# Patient Record
Sex: Female | Born: 1940 | Race: White | Hispanic: No | Marital: Single | Smoking: Never smoker
Health system: Southern US, Community
[De-identification: ages and names within clinical notes are randomized; demographics above are authoritative.]

## PROBLEM LIST (undated history)

## (undated) DIAGNOSIS — R42 Dizziness and giddiness: Secondary | ICD-10-CM

## (undated) DIAGNOSIS — I10 Essential (primary) hypertension: Secondary | ICD-10-CM

## (undated) DIAGNOSIS — H3530 Unspecified macular degeneration: Secondary | ICD-10-CM

## (undated) HISTORY — PX: ABDOMINAL HYSTERECTOMY: SHX81

## (undated) HISTORY — PX: RECTOCELE REPAIR: SHX761

## (undated) HISTORY — DX: Essential (primary) hypertension: I10

---

## 1998-07-04 ENCOUNTER — Other Ambulatory Visit: Admission: RE | Admit: 1998-07-04 | Discharge: 1998-07-04 | Payer: Self-pay | Admitting: Obstetrics and Gynecology

## 1999-08-28 ENCOUNTER — Other Ambulatory Visit: Admission: RE | Admit: 1999-08-28 | Discharge: 1999-08-28 | Payer: Self-pay | Admitting: Obstetrics and Gynecology

## 2010-03-26 IMAGING — CR DG CERVICAL SPINE COMPLETE 4+V
1 series · 6 of 6 positions shown · non-contrast
Comparison: NONE

CLINICAL DATA: Bilateral hand numbness and tingling. 

CERVICAL SPINE SERIES

[Series 1: view not recorded · 0.17mm/px · 6 of 6 slices shown]
[im 1/6]
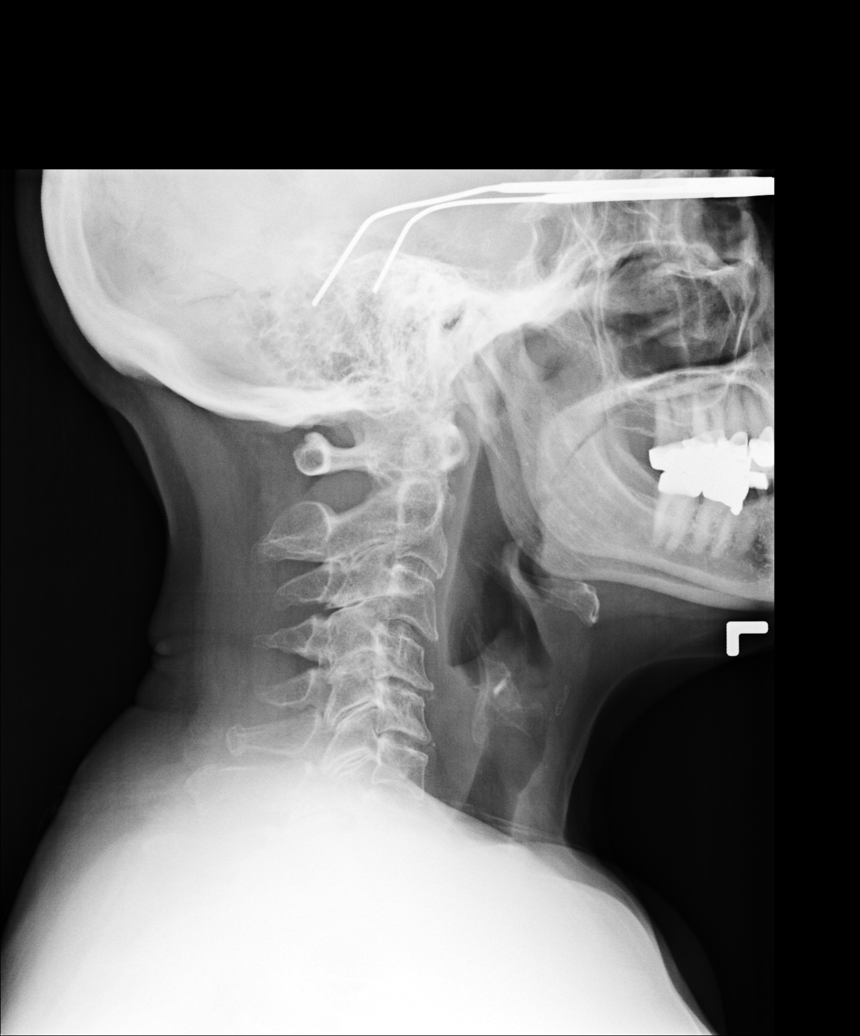
[im 2/6]
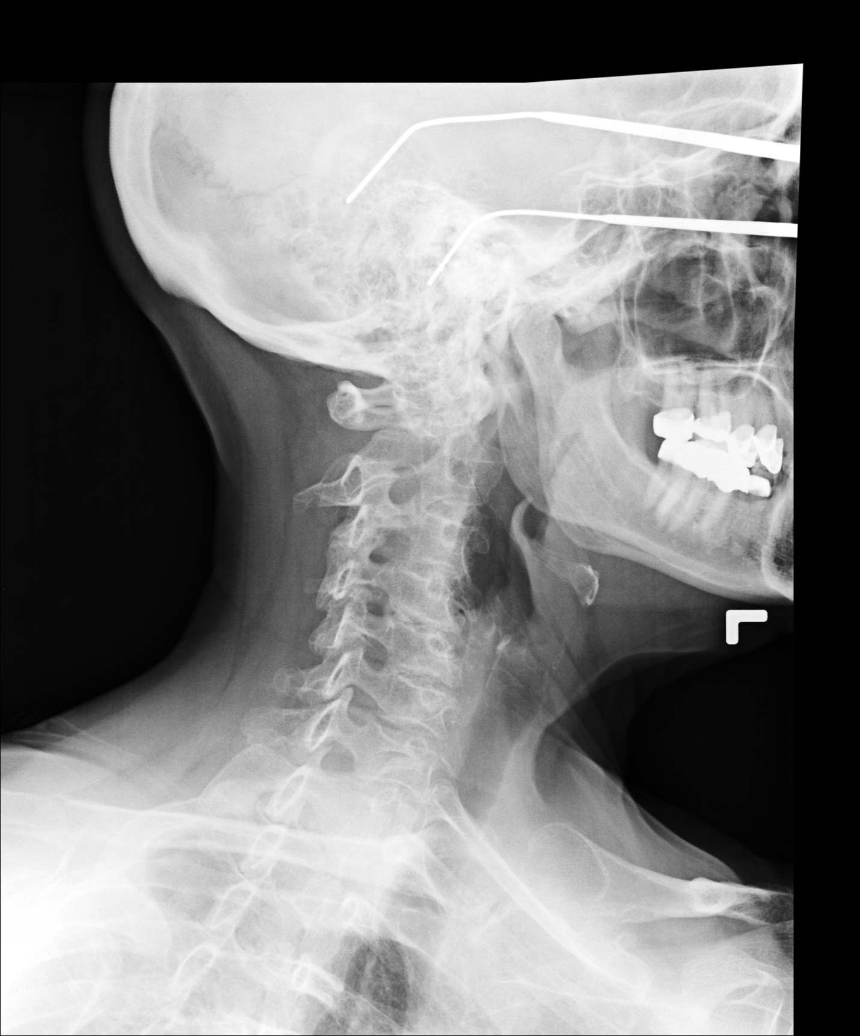
[im 3/6]
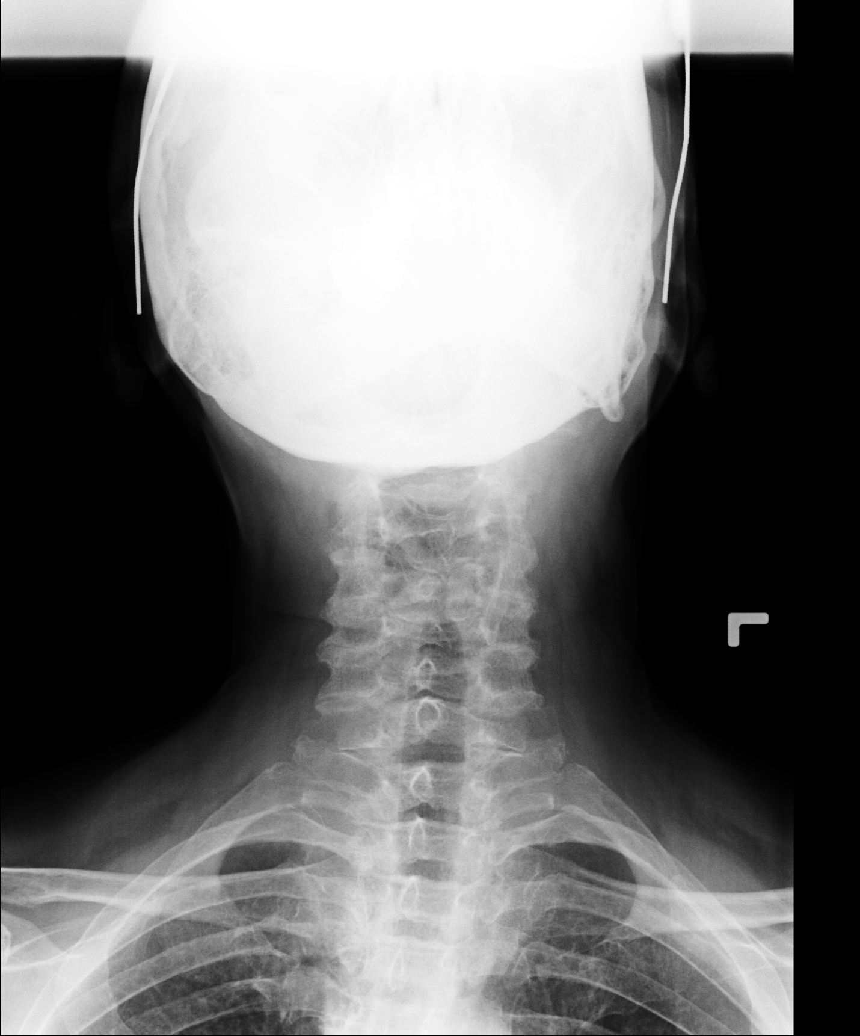
[im 4/6]
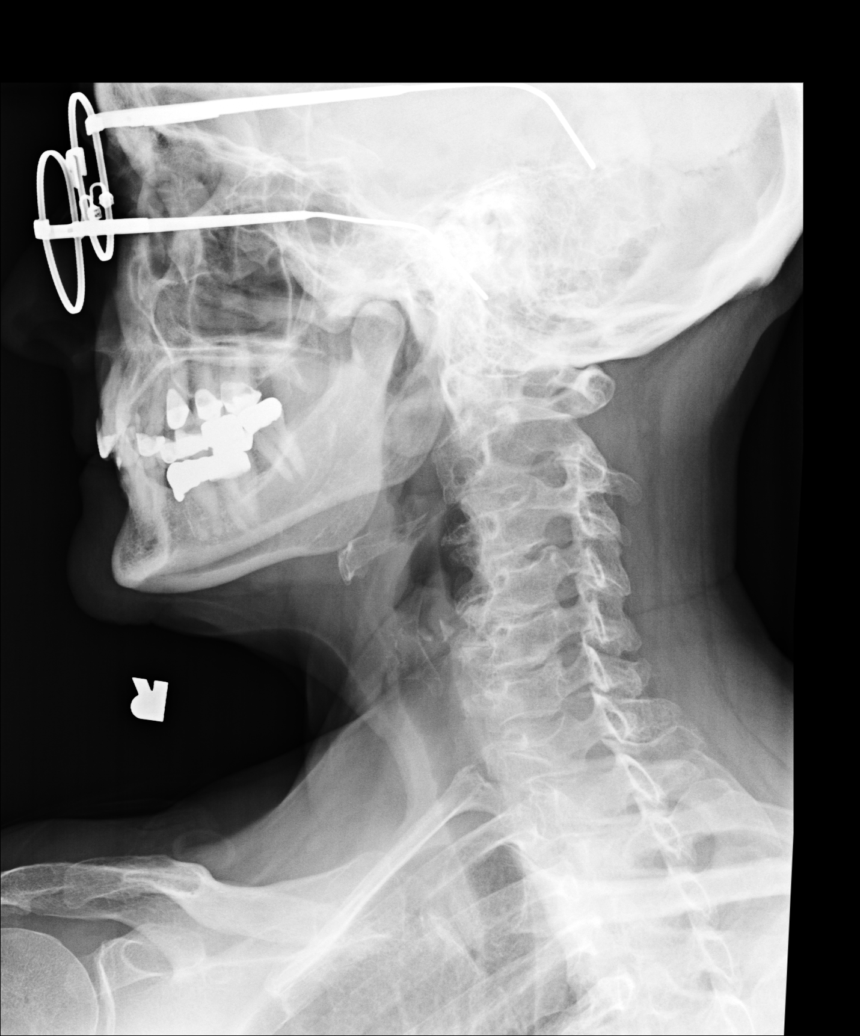
[im 5/6]
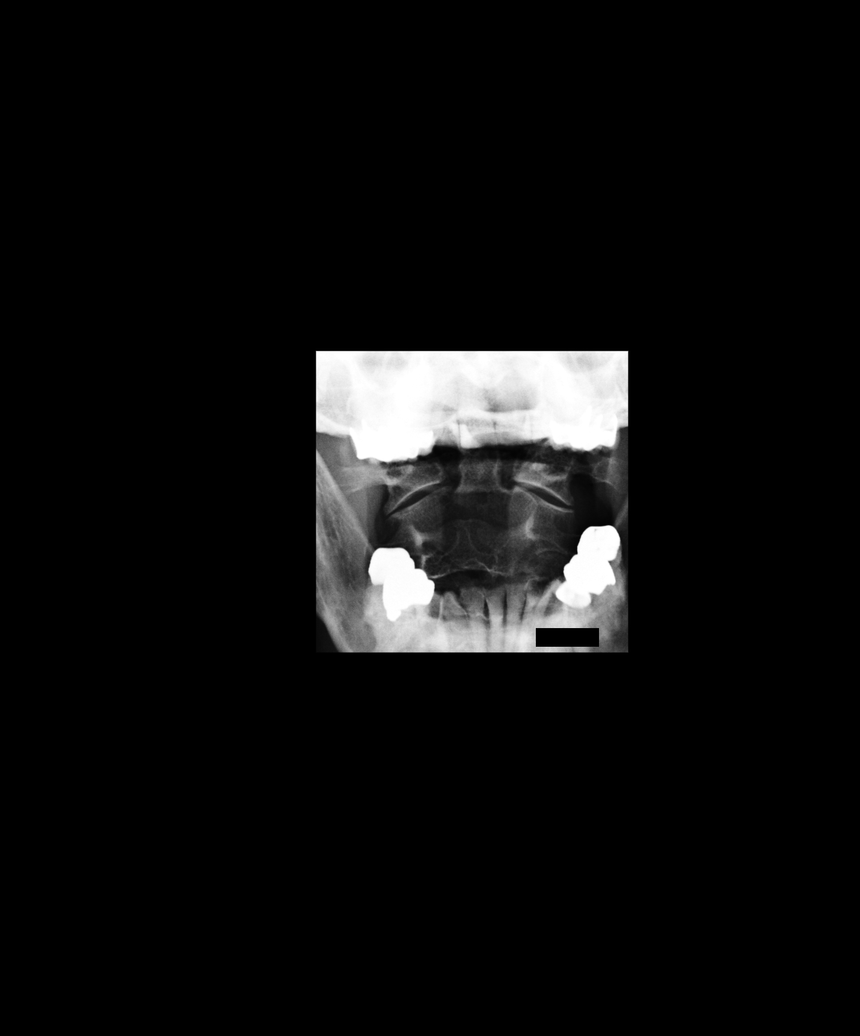
[im 6/6]
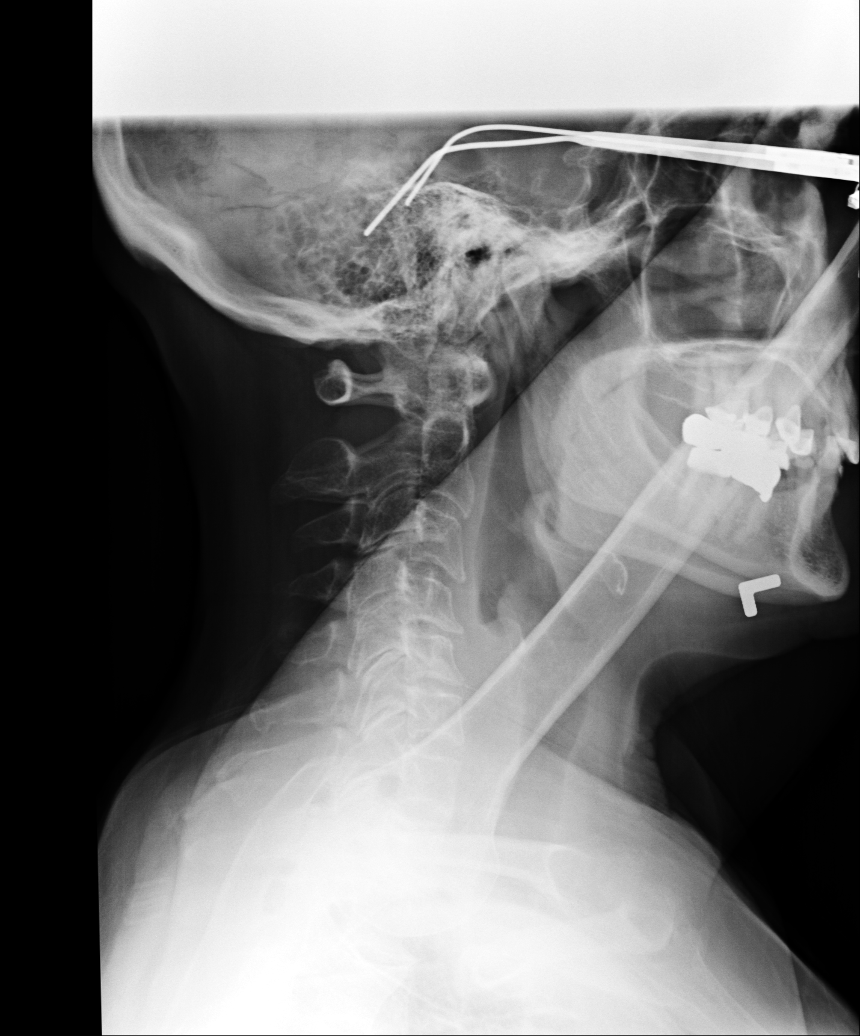

[6 of 6 positions shown; findings below may reference images not displayed]

FINDINGS: There is slight reversal of normal lordosis. Minimal 
forward slippage of C3 on C4 of 1-2 mm is noted. Mild disc space 
narrowing with spurring at C4-C5 and C5-C6. The exit foramina 
demonstrate minimal osteophytic encroachment but no high-grade 
stenosis. No fracture, dislocation, or prevertebral soft tissue 
swelling.
IMPRESSION: Loss of normal lordosis. Spondylosis. Jevg Matto 
07/17/2007  Tran Date:  07/17/2007 DAS  [REDACTED]

## 2010-04-11 ENCOUNTER — Ambulatory Visit: Payer: Medicare Other | Attending: Neurology | Admitting: Physical Therapy

## 2010-04-11 DIAGNOSIS — R269 Unspecified abnormalities of gait and mobility: Secondary | ICD-10-CM | POA: Insufficient documentation

## 2010-04-11 DIAGNOSIS — R42 Dizziness and giddiness: Secondary | ICD-10-CM | POA: Insufficient documentation

## 2010-04-11 DIAGNOSIS — IMO0001 Reserved for inherently not codable concepts without codable children: Secondary | ICD-10-CM | POA: Insufficient documentation

## 2010-04-18 ENCOUNTER — Ambulatory Visit: Payer: Medicare Other | Admitting: Physical Therapy

## 2010-05-25 ENCOUNTER — Encounter: Payer: Medicare Other | Admitting: Physical Therapy

## 2010-05-25 ENCOUNTER — Ambulatory Visit: Payer: Medicare Other | Attending: Neurology | Admitting: Physical Therapy

## 2010-05-25 DIAGNOSIS — R269 Unspecified abnormalities of gait and mobility: Secondary | ICD-10-CM | POA: Insufficient documentation

## 2010-05-25 DIAGNOSIS — IMO0001 Reserved for inherently not codable concepts without codable children: Secondary | ICD-10-CM | POA: Insufficient documentation

## 2010-05-25 DIAGNOSIS — R42 Dizziness and giddiness: Secondary | ICD-10-CM | POA: Insufficient documentation

## 2012-03-29 ENCOUNTER — Ambulatory Visit (INDEPENDENT_AMBULATORY_CARE_PROVIDER_SITE_OTHER): Payer: Medicare Other | Admitting: Internal Medicine

## 2012-03-29 VITALS — BP 168/92 | HR 126 | Temp 97.6°F | Resp 16 | Ht 61.25 in | Wt 167.4 lb

## 2012-03-29 DIAGNOSIS — I10 Essential (primary) hypertension: Secondary | ICD-10-CM

## 2012-03-29 MED ORDER — CHLORTHALIDONE 25 MG PO TABS: 25.0000 mg | ORAL_TABLET | Freq: Every day | ORAL | Status: DC

## 2012-03-29 NOTE — Progress Notes (Signed)
  Subjective:    Patient ID: Nicole Robertson, female    DOB: Jun 13, 1940, 72 y.o.   MRN: [id number]  HPI patient of Dr. Zachery Dauer who has had recent problems with blood pressure readings about which she is very anxious Medications have been changed/she has taken them episodically because she is afraid when she takes them of side effects She reports continued problems with being dizzy, fuzzy, tired and so forth for the last 5 or 6 years since beginning blood pressure medications/ she has put up with the symptoms but doesn't like them anymore and wants to quit  Past medical history negative up until the diagnosis of hypertension  [facility] Zachery Dauer Daughter nurse Sister is cna  Review of Systems No recent weight changes No fatigue No palpitations or chest pain No vision changes No paresthesias or peripheral neurological symptoms    Objective:   Physical Exam BP 168/92  Pulse 126  Temp(Src) 97.6 F (36.4 C) (Oral)  Resp 16  Ht 5' 1.25" (1.556 m)  Wt 167 lb 6.4 oz (75.932 kg)  BMI 31.36 kg/m2  SpO2 96% Very anxious HEENT clear No thyromegaly Heart regular without murmur/rate now 85 after tachycardia at presentation Lungs clear      Assessment & Plan:  Problem #1 hypertension and needed treatment Problem #2 anxiety about side effects Problem #3 anxiety about having a medical diagnosis  Advise restarting a new medicine she's never been on Chlorthalidone 25 mg daily Reassured that such recent onset hypertension and stable family history suggest it would be a long time before she has the secondary effects of hypertension  to followup with Dr. Zachery Dauer as scheduled

## 2012-03-31 DIAGNOSIS — I10 Essential (primary) hypertension: Secondary | ICD-10-CM | POA: Insufficient documentation

## 2012-04-04 ENCOUNTER — Emergency Department (HOSPITAL_COMMUNITY): Payer: Medicare Other

## 2012-04-04 ENCOUNTER — Inpatient Hospital Stay (HOSPITAL_COMMUNITY)
Admission: EM | Admit: 2012-04-04 | Discharge: 2012-04-06 | DRG: 641 | Disposition: A | Discharge status: Home / Self Care | Payer: Medicare Other | Attending: Internal Medicine | Admitting: Internal Medicine

## 2012-04-04 ENCOUNTER — Encounter (HOSPITAL_COMMUNITY): Payer: Self-pay | Admitting: Emergency Medicine

## 2012-04-04 DIAGNOSIS — E871 Hypo-osmolality and hyponatremia: Principal | ICD-10-CM

## 2012-04-04 DIAGNOSIS — E876 Hypokalemia: Secondary | ICD-10-CM | POA: Diagnosis present

## 2012-04-04 DIAGNOSIS — Y92009 Unspecified place in unspecified non-institutional (private) residence as the place of occurrence of the external cause: Secondary | ICD-10-CM

## 2012-04-04 DIAGNOSIS — I493 Ventricular premature depolarization: Secondary | ICD-10-CM | POA: Diagnosis present

## 2012-04-04 DIAGNOSIS — I10 Essential (primary) hypertension: Secondary | ICD-10-CM | POA: Diagnosis present

## 2012-04-04 DIAGNOSIS — T502X5A Adverse effect of carbonic-anhydrase inhibitors, benzothiadiazides and other diuretics, initial encounter: Secondary | ICD-10-CM | POA: Diagnosis present

## 2012-04-04 DIAGNOSIS — I4949 Other premature depolarization: Secondary | ICD-10-CM | POA: Diagnosis present

## 2012-04-04 DIAGNOSIS — R42 Dizziness and giddiness: Secondary | ICD-10-CM | POA: Diagnosis not present

## 2012-04-04 DIAGNOSIS — E878 Other disorders of electrolyte and fluid balance, not elsewhere classified: Secondary | ICD-10-CM

## 2012-04-04 DIAGNOSIS — Z90710 Acquired absence of both cervix and uterus: Secondary | ICD-10-CM

## 2012-04-04 DIAGNOSIS — H3530 Unspecified macular degeneration: Secondary | ICD-10-CM | POA: Diagnosis present

## 2012-04-04 HISTORY — DX: Dizziness and giddiness: R42

## 2012-04-04 HISTORY — DX: Unspecified macular degeneration: H35.30

## 2012-04-04 LAB — BASIC METABOLIC PANEL
Chloride: 83 mEq/L — ABNORMAL LOW (ref 96–112)
GFR calc Af Amer: >90 mL/min (ref >90)
GFR calc non Af Amer: 90 mL/min — ABNORMAL LOW (ref >90)
Potassium: 3 mEq/L — ABNORMAL LOW (ref 3.5–5.1)
Sodium: 118 mEq/L — CL (ref 135–145)

## 2012-04-04 LAB — CBC WITH DIFFERENTIAL/PLATELET
Hemoglobin: 13.5 g/dL (ref 12.0–15.0)
Lymphs Abs: 0.5 10*3/uL — ABNORMAL LOW (ref 0.7–4.0)
MCH: 31 pg (ref 26.0–34.0)
Monocytes Relative: 4 % (ref 3–12)
Neutro Abs: 8.1 10*3/uL — ABNORMAL HIGH (ref 1.7–7.7)
Neutrophils Relative %: 91 % — ABNORMAL HIGH (ref 43–77)
RBC: 4.36 MIL/uL (ref 3.87–5.11)

## 2012-04-04 LAB — URINE MICROSCOPIC-ADD ON

## 2012-04-04 LAB — COMPREHENSIVE METABOLIC PANEL
ALT: 18 U/L (ref 0–35)
Alkaline Phosphatase: 71 U/L (ref 39–117)
CO2: 24 mEq/L (ref 19–32)
Chloride: 74 mEq/L — ABNORMAL LOW (ref 96–112)
GFR calc Af Amer: >90 mL/min (ref >90)
GFR calc non Af Amer: >90 mL/min (ref >90)
Glucose, Bld: 123 mg/dL — ABNORMAL HIGH (ref 70–99)
Potassium: 3 mEq/L — ABNORMAL LOW (ref 3.5–5.1)
Sodium: 110 mEq/L — CL (ref 135–145)

## 2012-04-04 LAB — URINALYSIS, ROUTINE W REFLEX MICROSCOPIC
Bilirubin Urine: NEGATIVE
Ketones, ur: NEGATIVE mg/dL
Nitrite: NEGATIVE
Urobilinogen, UA: 0.2 mg/dL (ref 0.0–1.0)

## 2012-04-04 MED ORDER — POTASSIUM CHLORIDE IN NACL 40-0.9 MEQ/L-% IV SOLN
INTRAVENOUS | Status: DC
  Administered 2012-04-04 – 2012-04-05 (×2): via INTRAVENOUS
  Filled 2012-04-04 (×3): qty 1000

## 2012-04-04 MED ORDER — ONDANSETRON HCL 4 MG/2ML IJ SOLN
4.0000 mg | Freq: Once | INTRAMUSCULAR | Status: AC
  Administered 2012-04-04: 4 mg via INTRAVENOUS
  Filled 2012-04-04: qty 2

## 2012-04-04 MED ORDER — ACETAMINOPHEN 325 MG PO TABS: 650.0000 mg | ORAL_TABLET | Freq: Four times a day (QID) | ORAL | Status: DC | PRN

## 2012-04-04 MED ORDER — SODIUM CHLORIDE 0.9 % IV SOLN
INTRAVENOUS | Status: DC
  Administered 2012-04-04: 18:00:00 via INTRAVENOUS

## 2012-04-04 MED ORDER — ONDANSETRON HCL 4 MG/2ML IJ SOLN: 4.0000 mg | Freq: Four times a day (QID) | INTRAMUSCULAR | Status: DC | PRN

## 2012-04-04 MED ORDER — ATENOLOL 25 MG PO TABS
25.0000 mg | ORAL_TABLET | Freq: Two times a day (BID) | ORAL | Status: DC
  Administered 2012-04-04 – 2012-04-06 (×3): 25 mg via ORAL
  Filled 2012-04-04 (×5): qty 1

## 2012-04-04 MED ORDER — POTASSIUM CHLORIDE 10 MEQ/100ML IV SOLN
10.0000 meq | Freq: Once | INTRAVENOUS | Status: AC
  Administered 2012-04-04: 10 meq via INTRAVENOUS
  Filled 2012-04-04: qty 100

## 2012-04-04 MED ORDER — SODIUM CHLORIDE 0.9 % IV BOLUS (SEPSIS)
500.0000 mL | Freq: Once | INTRAVENOUS | Status: AC
  Administered 2012-04-04: 500 mL via INTRAVENOUS

## 2012-04-04 MED ORDER — ACETAMINOPHEN 650 MG RE SUPP: 650.0000 mg | Freq: Four times a day (QID) | RECTAL | Status: DC | PRN

## 2012-04-04 MED ORDER — SODIUM CHLORIDE 0.9 % IJ SOLN: 3.0000 mL | Freq: Two times a day (BID) | INTRAMUSCULAR | Status: DC

## 2012-04-04 MED ORDER — ONDANSETRON HCL 4 MG PO TABS: 4.0000 mg | ORAL_TABLET | Freq: Four times a day (QID) | ORAL | Status: DC | PRN

## 2012-04-04 MED ORDER — ALUM & MAG HYDROXIDE-SIMETH 200-200-20 MG/5ML PO SUSP: 30.0000 mL | Freq: Four times a day (QID) | ORAL | Status: DC | PRN

## 2012-04-04 MED ORDER — ONDANSETRON HCL 4 MG/2ML IJ SOLN: 4.0000 mg | Freq: Three times a day (TID) | INTRAMUSCULAR | Status: DC | PRN

## 2012-04-04 NOTE — ED Notes (Signed)
Nicole Robertson made aware of pt's critical sodium level of 110.

## 2012-04-04 NOTE — ED Notes (Signed)
Patient transported to X-ray 

## 2012-04-04 NOTE — ED Notes (Addendum)
Pt aware of the need for a urine sample. Pt unable to void at this time. 

## 2012-04-04 NOTE — ED Notes (Signed)
Critical lab value Sodium 110.

## 2012-04-04 NOTE — ED Notes (Signed)
Pt presenting to ed with c/o nausea and vomiting pt states she had a recent blood pressure medication change and her pcp thinks that she is having side effects from the medication. Pt denies abdominal pain. Pt states positive dizziness, lightheadedness and weakness.

## 2012-04-04 NOTE — H&P (Signed)
[facility] History and Physical  Nicole Robertson AVW:[id number] DOB: 07-05-1940 DOA: 04/04/2012  Referring physician: Dr. Ranae Palms PCP: Gaye Alken, MD   Chief Complaint: Dizziness   History of Present Illness: Nicole Robertson is an 72 y.o. female with a PMH of HTN and vertigo who was prescribed a diuretic on 5 days ago and subsequently developed worsening dizziness.  She had been on Atenolol and Diltiazem for about 5 years for BP control.  She saw PCP in February and BP was high, so was put on Lisinopril (stopped the Atenolol and Diltiazem), then saw PCP again 5 days ago and was told to stop the Lisinopril and start HCTZ.  Review of Systems: Constitutional: No fever, no chills;  Appetite normal; + 5 lb weight loss, no weight gain, +weakness.  HEENT: No blurry vision, no diplopia, no pharyngitis, no dysphagia, +PND CV: No chest pain, + palpitations.  Resp: No SOB, + cough. GI: + nausea and vomiting, + diarrhea, no melena, no hematochezia.  GU: No dysuria, no hematuria, no urgency.  MSK: no myalgias, no arthralgias.  Neuro:  No headache, no focal neurological deficits, no history of seizures, + vertigo.  Psych: No depression, no anxiety.  Endo: No thyroid disease, no DM, no heat intolerance, no cold intolerance, no polyuria but "I pee a lot"., no polydipsia, but "I drink a lot".  Skin: No rashes, no skin lesions.  Heme: No easy bruising, no history of blood diseases. ID: Has had shingles, flu and pneumonia vaccines.  Past Medical History Past Medical History  Diagnosis Date  . Hypertension   . Vertigo   . Macular degeneration      Past Surgical History Past Surgical History  Procedure Laterality Date  . Abdominal hysterectomy    . Rectocele repair      and bladder tack     Social History: History   Social History  . Marital Status: Single    Spouse Name: N/A    Number of Children: 2  . Years of Education: N/A   Occupational History  . Hair dresser.    Social  History Main Topics  . Smoking status: Never Smoker   . Smokeless tobacco: Never Used  . Alcohol Use: No  . Drug Use: No  . Sexually Active: No   Other Topics Concern  . Not on file   Social History Narrative   Divorced.  Independent of ADLs, lives with a roommate.  No assistive devices.  Still works.    Family History:  Family History  Problem Relation Age of Onset  . Heart failure Mother   . Hypertension Mother   . Dementia Father   . Stroke Brother   . Hypertension Sister   . Hypertension Sister   . Hypertension Sister   . Hypertension Sister     Allergies: Review of patient's allergies indicates no known allergies.  Meds: Prior to Admission medications   Medication Sig Start Date End Date Taking? Authorizing Provider  chlorthalidone (HYGROTON) 25 MG tablet Take 1 tablet (25 mg total) by mouth daily. 03/29/12  Yes Tonye Pearson, MD    Physical Exam: Filed Vitals:   04/04/12 1349 04/04/12 1526 04/04/12 1527 04/04/12 1530  BP: 176/82 162/74 150/86 140/75  Pulse: 88 106 105 107  Temp: 97.6 F (36.4 C)     TempSrc: Oral     Resp: 18     SpO2: 100%        Physical Exam: Blood pressure 140/75, pulse 107, temperature 97.6  F (36.4 C), temperature source Oral, resp. rate 18, SpO2 100.00%. Gen: No acute distress. Head: Normocephalic, atraumatic. Eyes: PERRL, EOMI, sclerae nonicteric. Mouth: Oropharynx clear.  No exudates. Neck: Supple, no thyromegaly, no lymphadenopathy, no jugular venous distention. Chest: Lungs CTAB. CV: Heart sounds regular, tachy, with a few premature beats. Abdomen: Soft, nontender, nondistended with normal active bowel sounds. Extremities: Extremities without C/E/C. Skin: Warm and dry. No rashes.   Neuro: Alert and oriented times 3; cranial nerves II through XII grossly intact. Psych: Mood and affect normal.  Labs on Admission:  Basic Metabolic Panel:  Recent Labs Lab 04/04/12 1419  NA 110*  K 3.0*  CL 74*  CO2 24  GLUCOSE  123*  BUN 6  CREATININE 0.57  CALCIUM 9.2   Liver Function Tests:  Recent Labs Lab 04/04/12 1419  AST 25  ALT 18  ALKPHOS 71  BILITOT 0.6  PROT 6.9  ALBUMIN 4.1   CBC:  Recent Labs Lab 04/04/12 1419  WBC 9.0  NEUTROABS 8.1*  HGB 13.5  HCT 36.1  MCV 82.8  PLT 279   Cardiac Enzymes:  Recent Labs Lab 04/04/12 1518  TROPONINI <0.30   Radiological Exams on Admission: Dg Chest 2 View  04/04/2012  *RADIOLOGY REPORT*  Clinical Data: Dizziness.  CHEST - 2 VIEW  Comparison: None  Findings: The cardiomediastinal silhouette is unremarkable. Mild left basilar atelectasis/scarring is noted. Elevation of the left hemidiaphragm is present. There is no evidence of focal airspace disease, pulmonary edema, suspicious pulmonary nodule/mass, pleural effusion, or pneumothorax. No acute bony abnormalities are identified.  IMPRESSION: Mild left basilar atelectasis/scarring without other significant abnormality.   Original Report Authenticated By: Harmon Pier, M.D.     EKG: Independently reviewed. Tachycardic with frequent PVCs.  Assessment/Plan Principal Problem:   Hyponatremia induced by chlorthalidone -D/C chlorthalidone, gently hydrate with NS, fluid restrict (has been drinking a lot of water). -Slowly correct sodium, check BMET Q 6 hours x 24 hours. Active Problems:   Essential hypertension, benign -Start atenolol given tachycardia and PVCs.  Has tolerated this in the past.   H/O Vertigo -Hold Meclizine for now.   Frequent PVCs with palpitations -Likely from electrolyte derangements.  Atenolol 25 mg Q 12 hours.   Hypokalemia -Replace in IVF.  Code Status: Full. Family Communication: Kirt Boys (daughter) Disposition Plan: Home when stable.  Time spent: 1 hour.   RAMA,CHRISTINA [facility] Pager [phone]  If 7PM-7AM, please contact night-coverage [email or web address] Password [facility] 04/04/2012, 6:08 PM

## 2012-04-04 NOTE — ED Provider Notes (Signed)
History     CSN: [id number]  Arrival date & time 04/04/12  1332   First MD Initiated Contact with Patient 04/04/12 1501      Chief Complaint  Patient presents with  . nausea and vomiting   . Dizziness    (Consider location/radiation/quality/duration/timing/severity/associated sxs/prior treatment) HPI Pt presents with 5 days of vomiting, lightheadedness generalized weakness. Pt was started on new medication for her BP 5 days ago and she believes this is a side effect of the medication. No CP, SOB, fever, chills, visual changes, abd pain, urinary symptoms. No blood in vomit Past Medical History  Diagnosis Date  . Hypertension     Past Surgical History  Procedure Laterality Date  . Abdominal hysterectomy      No family history on file.  History  Substance Use Topics  . Smoking status: Never Smoker   . Smokeless tobacco: Not on file  . Alcohol Use: No    OB History   Grav Para Term Preterm Abortions TAB SAB Ect Mult Living                  Review of Systems  Constitutional: Positive for fatigue. Negative for fever and chills.  HENT: Negative for congestion, sore throat, rhinorrhea, neck pain and neck stiffness.   Eyes: Negative for visual disturbance.  Respiratory: Negative for shortness of breath and wheezing.   Cardiovascular: Negative for chest pain, palpitations and leg swelling.  Gastrointestinal: Positive for nausea, vomiting and diarrhea. Negative for abdominal pain and constipation.  Genitourinary: Negative for dysuria, frequency and flank pain.  Skin: Negative for rash.  Neurological: Positive for dizziness and light-headedness. Negative for numbness and headaches.    Allergies  Review of patient's allergies indicates no known allergies.  Home Medications   Current Outpatient Rx  Name  Route  Sig  Dispense  Refill  . chlorthalidone (HYGROTON) 25 MG tablet   Oral   Take 1 tablet (25 mg total) by mouth daily.   30 tablet   2     BP 140/75   Pulse 107  Temp(Src) 97.6 F (36.4 C) (Oral)  Resp 18  SpO2 100%  Physical Exam  Nursing note and vitals reviewed. Constitutional: She is oriented to person, place, and time. She appears well-developed and well-nourished. No distress.  HENT:  Head: Normocephalic and atraumatic.  Mouth/Throat: Oropharynx is clear and moist. No oropharyngeal exudate.  Eyes: EOM are normal. Pupils are equal, round, and reactive to light.  Neck: Normal range of motion. Neck supple.  Cardiovascular: Normal rate and regular rhythm.   Pulmonary/Chest: Effort normal and breath sounds normal. No respiratory distress. She has no wheezes. She has no rales. She exhibits no tenderness.  Abdominal: Soft. Bowel sounds are normal. She exhibits no distension and no mass. There is no tenderness. There is no rebound and no guarding.  Musculoskeletal: Normal range of motion. She exhibits no edema and no tenderness.  Neurological: She is alert and oriented to person, place, and time.  5/5 motor, sensation intact, normal speech, unable to replicate dizziness by having pt sit up from lying position.   Skin: Skin is warm and dry. No rash noted. No erythema.  Psychiatric: She has a normal mood and affect. Her behavior is normal.    ED Course  Procedures (including critical care time)  Labs Reviewed  CBC WITH DIFFERENTIAL - Abnormal; Notable for the following:    MCHC 37.4 (*)    Neutrophils Relative 91 (*)    Neutro Abs  8.1 (*)    Lymphocytes Relative 6 (*)    Lymphs Abs 0.5 (*)    All other components within normal limits  COMPREHENSIVE METABOLIC PANEL - Abnormal; Notable for the following:    Sodium 110 (*)    Potassium 3.0 (*)    Chloride 74 (*)    Glucose, Bld 123 (*)    All other components within normal limits  URINALYSIS, ROUTINE W REFLEX MICROSCOPIC - Abnormal; Notable for the following:    Leukocytes, UA MODERATE (*)    All other components within normal limits  URINE MICROSCOPIC-ADD ON - Abnormal;  Notable for the following:    Bacteria, UA FEW (*)    All other components within normal limits  URINE CULTURE  TROPONIN I   Dg Chest 2 View  04/04/2012  *RADIOLOGY REPORT*  Clinical Data: Dizziness.  CHEST - 2 VIEW  Comparison: None  Findings: The cardiomediastinal silhouette is unremarkable. Mild left basilar atelectasis/scarring is noted. Elevation of the left hemidiaphragm is present. There is no evidence of focal airspace disease, pulmonary edema, suspicious pulmonary nodule/mass, pleural effusion, or pneumothorax. No acute bony abnormalities are identified.  IMPRESSION: Mild left basilar atelectasis/scarring without other significant abnormality.   Original Report Authenticated By: Harmon Pier, M.D.      1. Hyponatremia   2. Hypokalemia   3. Hypochloremia       Date: 04/04/2012  Rate: 91  Rhythm: normal sinus rhythm  QRS Axis: normal  Intervals: normal  ST/T Wave abnormalities: nonspecific T wave changes  Conduction Disutrbances:none  Narrative Interpretation:   Old EKG Reviewed: none available Multiple PVC's.     MDM   Discussed with Dr Darnelle Catalan. Will admit.   Supposed common side effect of chlorthalidone is low Na, K, Cl.        Loren Racer, MD 04/04/12 1655

## 2012-04-05 LAB — BASIC METABOLIC PANEL
BUN: 5 mg/dL — ABNORMAL LOW (ref 6–23)
BUN: 4 mg/dL — ABNORMAL LOW (ref 6–23)
CO2: 25 mEq/L (ref 19–32)
Calcium: 8.6 mg/dL (ref 8.4–10.5)
Calcium: 8.7 mg/dL (ref 8.4–10.5)
Creatinine, Ser: 0.64 mg/dL (ref 0.50–1.10)
Creatinine, Ser: 0.65 mg/dL (ref 0.50–1.10)
GFR calc Af Amer: >90 mL/min (ref >90)
GFR calc Af Amer: >90 mL/min (ref >90)
GFR calc non Af Amer: 88 mL/min — ABNORMAL LOW (ref >90)
GFR calc non Af Amer: 87 mL/min — ABNORMAL LOW (ref >90)
Glucose, Bld: 93 mg/dL (ref 70–99)
Potassium: 3.1 mEq/L — ABNORMAL LOW (ref 3.5–5.1)
Sodium: 122 mEq/L — ABNORMAL LOW (ref 135–145)

## 2012-04-05 LAB — MAGNESIUM: Magnesium: 2 mg/dL (ref 1.5–2.5)

## 2012-04-05 MED ORDER — SODIUM CHLORIDE 0.9 % IV SOLN
INTRAVENOUS | Status: DC
  Administered 2012-04-05 – 2012-04-06 (×2): via INTRAVENOUS

## 2012-04-05 MED ORDER — POTASSIUM CHLORIDE 10 MEQ/100ML IV SOLN
10.0000 meq | INTRAVENOUS | Status: AC
  Administered 2012-04-05 (×4): 10 meq via INTRAVENOUS
  Filled 2012-04-05 (×4): qty 100

## 2012-04-05 NOTE — Progress Notes (Signed)
[facility] PROGRESS NOTE  Nicole Robertson:[id number] DOB: 1940/09/02 DOA: 04/04/2012 PCP: Gaye Alken, MD  Brief narrative: Nicole Robertson is an 72 y.o. female with a PMH of HTN and vertigo who was prescribed a diuretic on 5 days ago and subsequently developed worsening dizziness. She had been on Atenolol and Diltiazem for about 5 years for BP control. She saw PCP in February and BP was high, so was put on Lisinopril (stopped the Atenolol and Diltiazem), then saw PCP again 5 days ago and was told to stop the Lisinopril and start HCTZ.  Assessment/Plan: Principal Problem:  Hyponatremia induced by chlorthalidone  -Off chlorthalidone, continue to gently hydrate with NS, continue to fluid restrict (has been drinking a lot of water).  -Slowly correcting sodium, checked BMET Q 6 hours x 24 hours, then daily.  Active Problems:  Essential hypertension, benign  -Continue atenolol, tachycardia and PVCs improved. Has tolerated this in the past.  H/O Vertigo  -Hold Meclizine for now.  Frequent PVCs with palpitations  -Likely from electrolyte derangements, improved with improvements in electrolytes.  -ContinueAtenolol 25 mg Q 12 hours.  Hypokalemia  -Continue to replace in IVF.  Give 4 10 mEq runs of potassium today. -Magnesium checked and within normal limits.  Code Status: Full.  Family Communication: Kirt Boys (daughter) updated by telephone. Disposition Plan: Home when stable.   Medical Consultants:  None.  Other Consultants:  None.  Anti-infectives:  None.  HPI/Subjective: Nicole Robertson is feeling much better today. No subjective arrhythmia. Less anxious. No chest pain or dyspnea.  Objective: Filed Vitals:   04/04/12 1839 04/04/12 2044 04/04/12 2313 04/05/12 0504  BP: 162/88 138/71 128/69 119/57  Pulse: 89 98 84   Temp: 97.8 F (36.6 C) 97.6 F (36.4 C)  97.5 F (36.4 C)  TempSrc: Oral Oral  Oral  Resp: 18 20    Height: 5\' 1"  (1.549 m)     Weight: 73.846 kg (162  lb 12.8 oz)     SpO2: 99% 98%  97%    Intake/Output Summary (Last 24 hours) at 04/05/12 1324 Last data filed at 04/05/12 0930  Gross per 24 hour  Intake 1466.67 ml  Output      0 ml  Net 1466.67 ml    Exam: Gen:  NAD Cardiovascular:  RRR, No M/R/G Respiratory:  Lungs CTAB Gastrointestinal:  Abdomen soft, NT/ND, + BS Extremities:  No C/E/C  Data Reviewed: Basic Metabolic Panel:  Recent Labs Lab 04/04/12 1419 04/04/12 1956 04/05/12 0055 04/05/12 0650  NA 110* 118* 122* 126*  K 3.0* 3.0* 3.1* 3.3*  CL 74* 83* 88* 94*  CO2 24 26 25 26   GLUCOSE 123* 130* 93 92  BUN 6 4* 5* 4*  CREATININE 0.57 0.58 0.61 0.64  CALCIUM 9.2 8.9 8.6 8.6  MG  --   --   --  2.0   GFR Estimated Creatinine Clearance: 58.4 ml/min (by C-G formula based on Cr of 0.64). Liver Function Tests:  Recent Labs Lab 04/04/12 1419  AST 25  ALT 18  ALKPHOS 71  BILITOT 0.6  PROT 6.9  ALBUMIN 4.1   CBC:  Recent Labs Lab 04/04/12 1419  WBC 9.0  NEUTROABS 8.1*  HGB 13.5  HCT 36.1  MCV 82.8  PLT 279   Cardiac Enzymes:  Recent Labs Lab 04/04/12 1518  TROPONINI <0.30    Procedures and Diagnostic Studies: Dg Chest 2 View  04/04/2012  *RADIOLOGY REPORT*  Clinical Data: Dizziness.  CHEST - 2 VIEW  Comparison: None  Findings: The cardiomediastinal silhouette is unremarkable. Mild left basilar atelectasis/scarring is noted. Elevation of the left hemidiaphragm is present. There is no evidence of focal airspace disease, pulmonary edema, suspicious pulmonary nodule/mass, pleural effusion, or pneumothorax. No acute bony abnormalities are identified.  IMPRESSION: Mild left basilar atelectasis/scarring without other significant abnormality.   Original Report Authenticated By: Harmon Pier, M.D.     Scheduled Meds: . atenolol  25 mg Oral BID  . sodium chloride  3 mL Intravenous Q12H   Continuous Infusions: . 0.9 % NaCl with KCl 40 mEq / L 100 mL/hr at 04/05/12 0717    Time spent: 25 minutes.    LOS: 1 day   RAMA,CHRISTINA  [facility] Pager [phone].  If 8PM-8AM, please contact night-coverage at [email or web address], password [facility] 04/05/2012, 1:24 PM

## 2012-04-06 LAB — BASIC METABOLIC PANEL
BUN: 7 mg/dL (ref 6–23)
CO2: 24 mEq/L (ref 19–32)
Calcium: 8.5 mg/dL (ref 8.4–10.5)
Calcium: 8.7 mg/dL (ref 8.4–10.5)
Creatinine, Ser: 0.61 mg/dL (ref 0.50–1.10)
Creatinine, Ser: 0.59 mg/dL (ref 0.50–1.10)
GFR calc non Af Amer: 89 mL/min — ABNORMAL LOW (ref >90)
Glucose, Bld: 96 mg/dL (ref 70–99)
Sodium: 128 mEq/L — ABNORMAL LOW (ref 135–145)

## 2012-04-06 MED ORDER — POTASSIUM CHLORIDE IN NACL 40-0.9 MEQ/L-% IV SOLN
INTRAVENOUS | Status: DC
  Administered 2012-04-06: 75 mL/h via INTRAVENOUS
  Filled 2012-04-06: qty 1000

## 2012-04-06 MED ORDER — FLUTICASONE PROPIONATE 50 MCG/ACT NA SUSP: 2.0000 | Freq: Every day | NASAL | Status: AC

## 2012-04-06 MED ORDER — POTASSIUM CHLORIDE CRYS ER 20 MEQ PO TBCR
20.0000 meq | EXTENDED_RELEASE_TABLET | Freq: Two times a day (BID) | ORAL | Status: DC
  Administered 2012-04-06: 20 meq via ORAL
  Filled 2012-04-06 (×2): qty 1

## 2012-04-06 MED ORDER — POTASSIUM CHLORIDE CRYS ER 20 MEQ PO TBCR: 20.0000 meq | EXTENDED_RELEASE_TABLET | Freq: Two times a day (BID) | ORAL | Status: AC

## 2012-04-06 MED ORDER — ATENOLOL 25 MG PO TABS: 25.0000 mg | ORAL_TABLET | Freq: Two times a day (BID) | ORAL | Status: AC

## 2012-04-06 NOTE — Progress Notes (Signed)
Nutrition Brief Note  Patient identified on the Malnutrition Screening Tool (MST) Report  Body mass index is 30.78 kg/(m^2). Patient meets criteria for obesity grade 1 based on current BMI.   Current diet order is regular, patient is consuming approximately 50% of meals at this time. Labs and medications reviewed.   Patient now eating fair with expected improvement.  Discussed patient's nutrition concerns with her regarding her health.  No nutrition interventions warranted at this time. If nutrition issues arise, please consult RD.   Oran Rein, RD, LDN Clinical Inpatient Dietitian Pager:  [phone] Weekend and after hours pager:  [phone]

## 2012-04-06 NOTE — Progress Notes (Signed)
Utilization Review Completed.Anzleigh Slaven T3/16/2014  

## 2012-04-06 NOTE — Discharge Summary (Signed)
Physician Discharge Summary  Nicole Robertson ZOX:[id number] DOB: Nov 23, 1940 DOA: 04/04/2012  PCP: Gaye Alken, MD  Admit date: 04/04/2012 Discharge date: 04/06/2012  Recommendations for Outpatient Follow-up:  1. Recommend followup with PCP for recheck of blood pressure and electrolytes in 2-3 days. 2. Followup urine cultures. Patient was without symptoms of a urinary tract infection so she was not empirically treated but preliminary cultures are growing greater than 100,000 colonies of gram-positive cocci.  Discharge Diagnoses:  Principal Problem:    Hyponatremia induced by chlorthalidone Active Problems:    Essential hypertension, benign    H/O Vertigo    Frequent PVCs with palpitations    Hypokalemia   Discharge Condition: Improved.  Diet recommendation: Regular.  History of present illness:  Nicole Robertson is an 72 y.o. female with a PMH of HTN and vertigo who was prescribed a diuretic on 5 days ago and subsequently developed worsening dizziness. She had been on Atenolol and Diltiazem for about 5 years for BP control. She saw PCP in February and BP was high, so was put on Lisinopril (stopped the Atenolol and Diltiazem), then saw PCP again 5 days ago and was told to stop the Lisinopril and start HCTZ.  Hospital Course by problem:  Principal Problem:  Hyponatremia induced by chlorthalidone  -Off chlorthalidone, hydrated with normal saline, put on a fluid restriction (had been drinking a lot of water).  -Her chemistries were checked q. 6 hours for the first 24 hours and then periodically thereafter. She had steady improvement in her sodium. -Patient was counseled extensively regarding the need for ongoing restriction of fluid intake at discharge to 1.5 L in a 24-hour period of time. Active Problems:  Essential hypertension, benign  -Atenolol was resumed, tachycardia and PVCs improved. Has tolerated this in the past.  H/O Vertigo  -Meclizine on hold while in the hospital.   Frequent PVCs with palpitations  -Likely from electrolyte derangements, improved with improvements in electrolytes.  -Continue Atenolol 25 mg Q 12 hours at discharge.  Hypokalemia  -Aggressively replaced to normal values, potassium then removed from IV fluids but the patient's potassium dropped so she was sent home on potassium supplementation..  -Magnesium checked and within normal limits.   Procedures:  None.  Consultations:  None.  Discharge Exam: Filed Vitals:   04/06/12 1521  BP: 107/68  Pulse: 66  Temp: 97.9 F (36.6 C)  Resp: 20   Filed Vitals:   04/06/12 0455 04/06/12 0550 04/06/12 1016 04/06/12 1521  BP: 130/107 132/64 150/70 107/68  Pulse: 60  68 66  Temp: 98.3 F (36.8 C)   97.9 F (36.6 C)  TempSrc: Oral   Oral  Resp: 20   20  Height:      Weight:      SpO2: 97%   97%    Gen:  NAD Cardiovascular:  RRR, No M/R/G Respiratory: Lungs CTAB Gastrointestinal: Abdomen soft, NT/ND with normal active bowel sounds. Extremities: No C/E/C   Discharge Instructions  Discharge Orders   Future Orders Complete By Expires     Call MD for:  persistant dizziness or light-headedness  As directed     Call MD for:  persistant nausea and vomiting  As directed     Call MD for:  As directed     Scheduling Instructions:      Weakness, heart palpitations, racing heart.    Diet general  As directed     Scheduling Instructions:      Limit your fluid intake to no  more than 1.5 liters (about 5 cups of fluid) daily.  You may have extra broth.    Discharge instructions  As directed     Comments:      You can use any OTC antihistamines (Claritin, Zyrtec, Allegra)  for relief of nasal symptoms, but do not use combination products that contain a decongestant, as these will run your blood pressure up.    Increase activity slowly  As directed         Medication List    STOP taking these medications       chlorthalidone 25 MG tablet  Commonly known as:  HYGROTON       TAKE these medications       atenolol 25 MG tablet  Commonly known as:  TENORMIN  Take 1 tablet (25 mg total) by mouth 2 (two) times daily.     fluticasone 50 MCG/ACT nasal spray  Commonly known as:  FLONASE  Place 2 sprays into the nose daily.     potassium chloride SA 20 MEQ tablet  Commonly known as:  K-DUR,KLOR-CON  Take 1 tablet (20 mEq total) by mouth 2 (two) times daily.           Follow-up Information   Follow up with Gaye Alken, MD. Schedule an appointment as soon as possible for a visit in 2 days. (For a re-check of your electrolytes and BP)    Contact information:   [address] [place] [address] [phone]        The results of significant diagnostics from this hospitalization (including imaging, microbiology, ancillary and laboratory) are listed below for reference.    Significant Diagnostic Studies: Dg Chest 2 View  04/04/2012  *RADIOLOGY REPORT*  Clinical Data: Dizziness.  CHEST - 2 VIEW  Comparison: None  Findings: The cardiomediastinal silhouette is unremarkable. Mild left basilar atelectasis/scarring is noted. Elevation of the left hemidiaphragm is present. There is no evidence of focal airspace disease, pulmonary edema, suspicious pulmonary nodule/mass, pleural effusion, or pneumothorax. No acute bony abnormalities are identified.  IMPRESSION: Mild left basilar atelectasis/scarring without other significant abnormality.   Original Report Authenticated By: Harmon Pier, M.D.     Labs:  Basic Metabolic Panel:  Recent Labs Lab 04/05/12 0055 04/05/12 0650 04/05/12 1305 04/06/12 0915 04/06/12 1510  NA 122* 126* 126* 128* 128*  K 3.1* 3.3* 4.4 3.2* 3.9  CL 88* 94* 95* 96 96  CO2 25 26 25 24 22   GLUCOSE 93 92 106* 131* 96  BUN 5* 4* 7 7 6   CREATININE 0.61 0.64 0.65 0.61 0.59  CALCIUM 8.6 8.6 8.7 8.5 8.7  MG  --  2.0  --   --  1.8   GFR Estimated Creatinine Clearance: 58.4 ml/min (by C-G formula based on Cr of 0.59). Liver  Function Tests:  Recent Labs Lab 04/04/12 1419  AST 25  ALT 18  ALKPHOS 71  BILITOT 0.6  PROT 6.9  ALBUMIN 4.1   CBC:  Recent Labs Lab 04/04/12 1419  WBC 9.0  NEUTROABS 8.1*  HGB 13.5  HCT 36.1  MCV 82.8  PLT 279   Cardiac Enzymes:  Recent Labs Lab 04/04/12 1518  TROPONINI <0.30   Microbiology Recent Results (from the past 240 hour(s))  URINE CULTURE     Status: None   Collection Time    04/04/12  3:24 PM      Result Value Range Status   Specimen Description URINE, RANDOM   Final   Special Requests NONE  Final   Culture  Setup Time 04/05/2012 01:23   Final   Colony Count >=100,000 COLONIES/ML   Final   Culture GRAM POSITIVE COCCI   Final   Report Status PENDING   Incomplete    Time coordinating discharge: 35 minutes.  Signed:  Sherle Mello  Pager [phone] [facility] 04/06/2012, 6:19 PM

## 2012-04-08 LAB — URINE CULTURE: Colony Count: >=100000

## 2014-12-14 IMAGING — CR DG CHEST 2V
2 series · 2 of 2 positions shown · non-contrast
Comparison: None

CLINICAL DATA: Dizziness.

CHEST - 2 VIEW

[w chest lat]
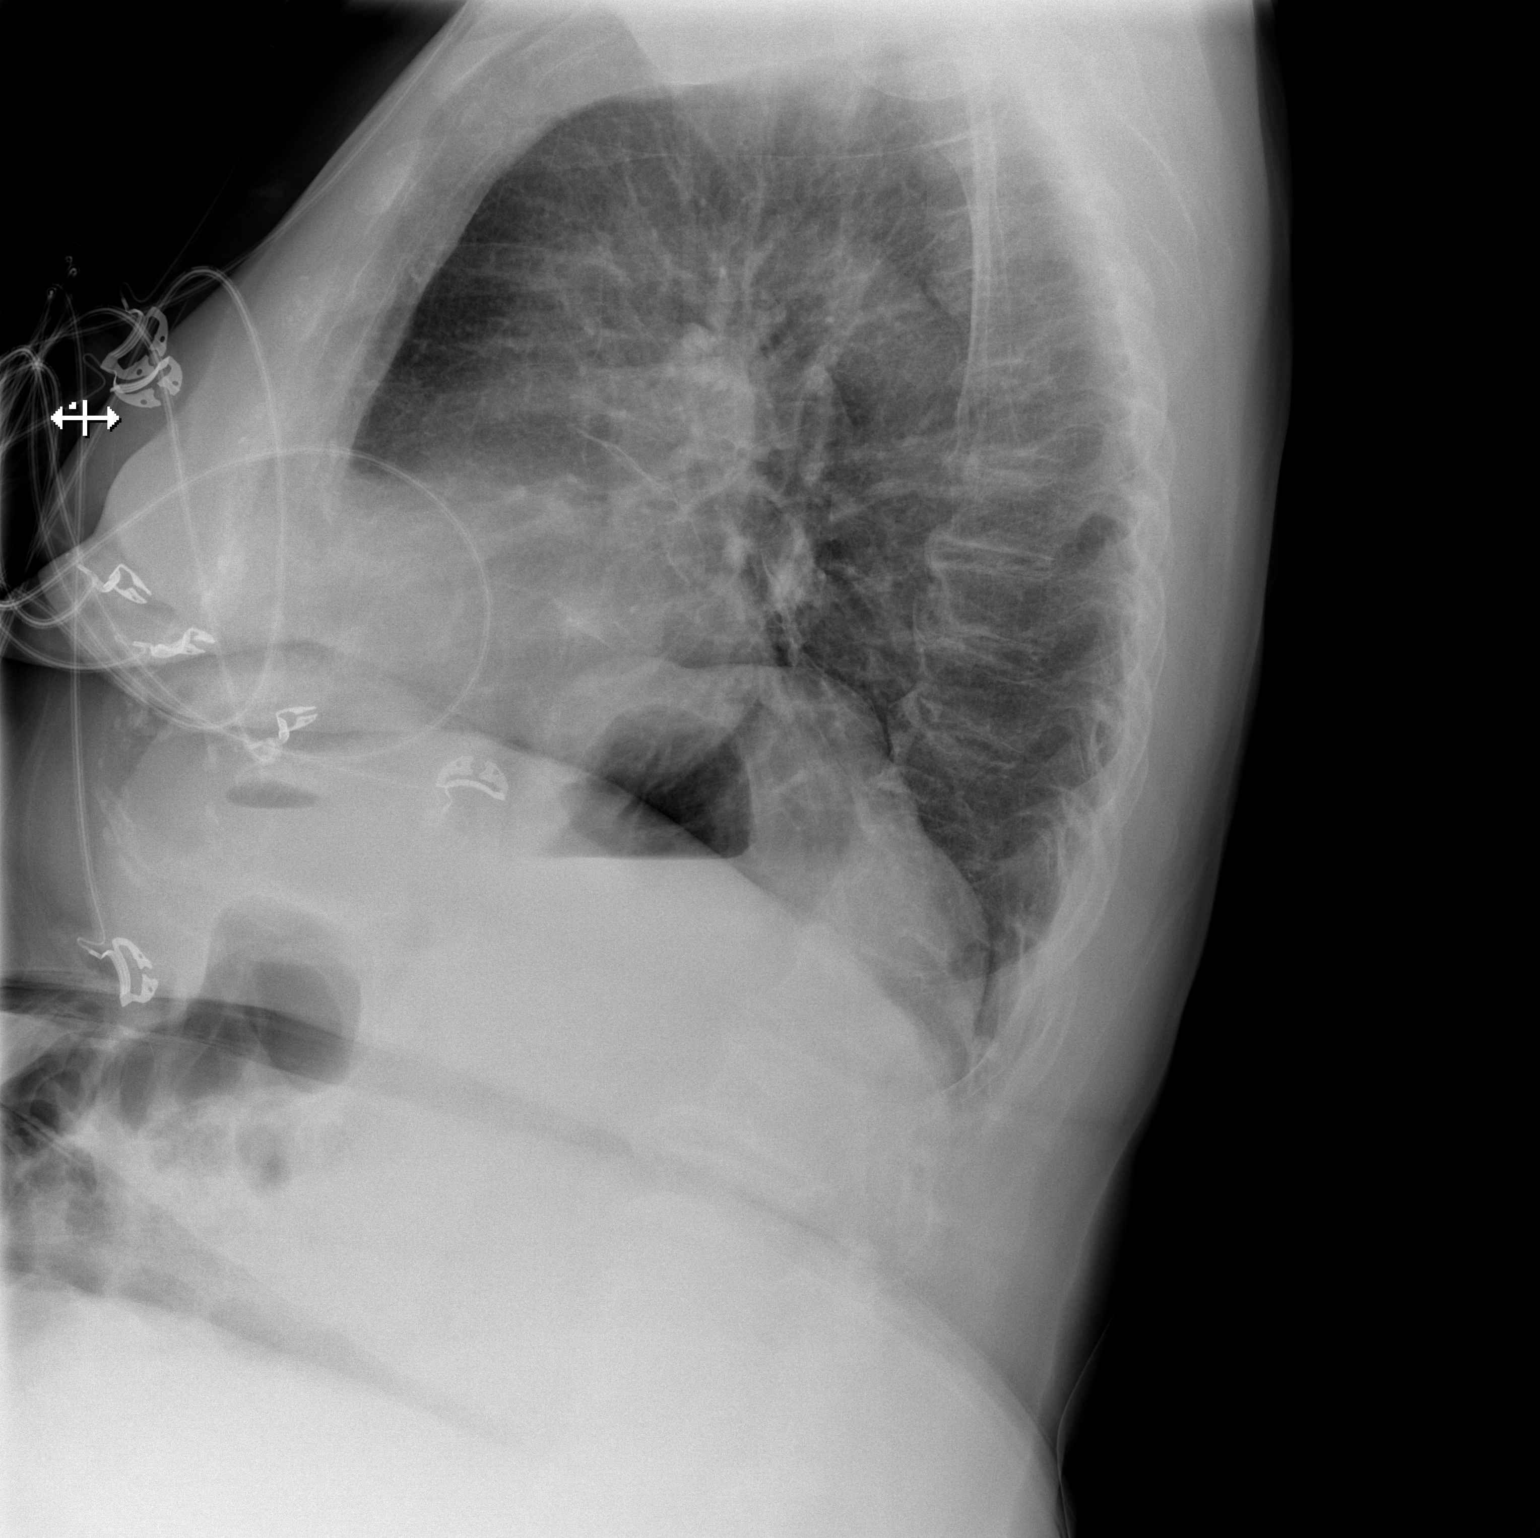

[x chest ap]
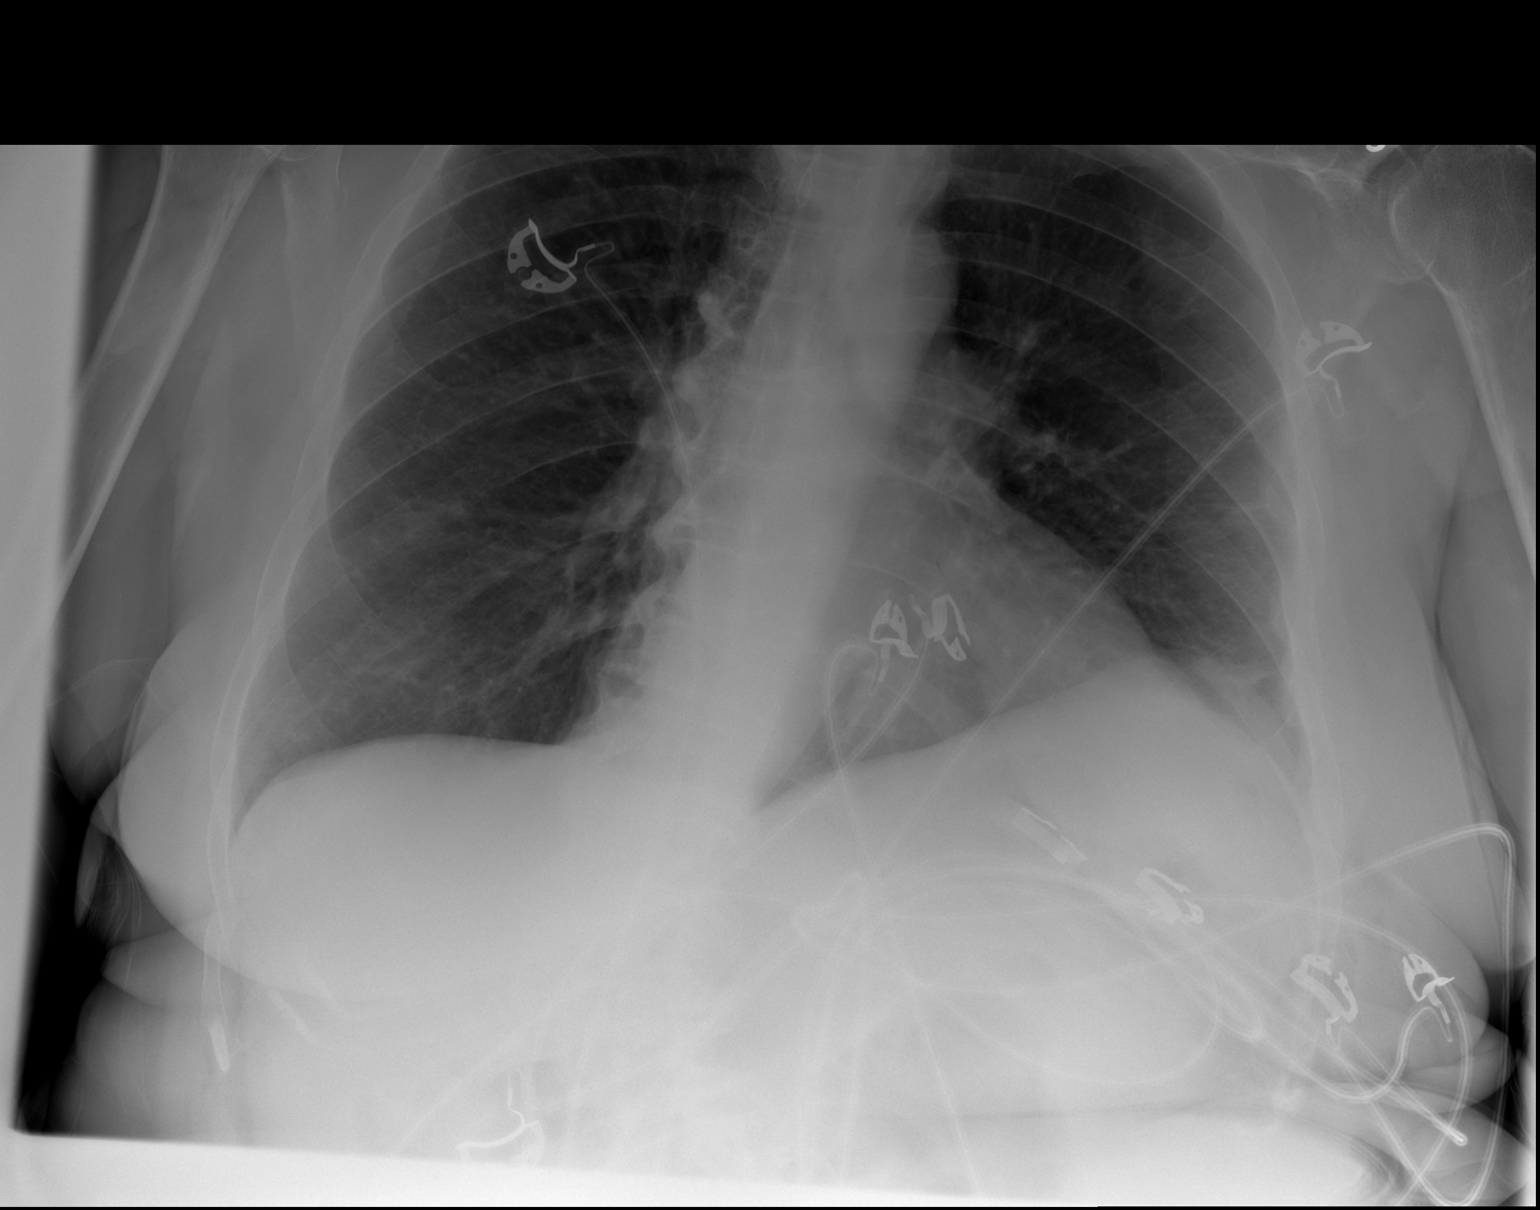

[2 of 2 positions shown; findings below may reference images not displayed]

FINDINGS: The cardiomediastinal silhouette is unremarkable.
Mild left basilar atelectasis/scarring is noted.
Elevation of the left hemidiaphragm is present.
There is no evidence of focal airspace disease, pulmonary edema,
suspicious pulmonary nodule/mass, pleural effusion, or
pneumothorax.
No acute bony abnormalities are identified.
IMPRESSION: Mild left basilar atelectasis/scarring without other significant
abnormality.

## 2016-06-04 DIAGNOSIS — I10 Essential (primary) hypertension: Secondary | ICD-10-CM | POA: Diagnosis not present

## 2016-11-05 DIAGNOSIS — H353132 Nonexudative age-related macular degeneration, bilateral, intermediate dry stage: Secondary | ICD-10-CM | POA: Diagnosis not present

## 2016-11-05 DIAGNOSIS — H25013 Cortical age-related cataract, bilateral: Secondary | ICD-10-CM | POA: Diagnosis not present

## 2016-11-05 DIAGNOSIS — H2513 Age-related nuclear cataract, bilateral: Secondary | ICD-10-CM | POA: Diagnosis not present

## 2016-11-20 DIAGNOSIS — H25012 Cortical age-related cataract, left eye: Secondary | ICD-10-CM | POA: Diagnosis not present

## 2016-11-20 DIAGNOSIS — H25013 Cortical age-related cataract, bilateral: Secondary | ICD-10-CM | POA: Diagnosis not present

## 2016-11-20 DIAGNOSIS — H25042 Posterior subcapsular polar age-related cataract, left eye: Secondary | ICD-10-CM | POA: Diagnosis not present

## 2016-11-20 DIAGNOSIS — H2513 Age-related nuclear cataract, bilateral: Secondary | ICD-10-CM | POA: Diagnosis not present

## 2016-11-20 DIAGNOSIS — H2512 Age-related nuclear cataract, left eye: Secondary | ICD-10-CM | POA: Diagnosis not present

## 2016-11-28 DIAGNOSIS — H2512 Age-related nuclear cataract, left eye: Secondary | ICD-10-CM | POA: Diagnosis not present

## 2016-11-28 DIAGNOSIS — H52222 Regular astigmatism, left eye: Secondary | ICD-10-CM | POA: Diagnosis not present

## 2016-11-28 DIAGNOSIS — H25812 Combined forms of age-related cataract, left eye: Secondary | ICD-10-CM | POA: Diagnosis not present

## 2016-12-17 DIAGNOSIS — Z9841 Cataract extraction status, right eye: Secondary | ICD-10-CM | POA: Diagnosis not present

## 2016-12-17 DIAGNOSIS — I10 Essential (primary) hypertension: Secondary | ICD-10-CM | POA: Diagnosis not present

## 2016-12-17 DIAGNOSIS — F419 Anxiety disorder, unspecified: Secondary | ICD-10-CM | POA: Diagnosis not present

## 2016-12-17 DIAGNOSIS — Z9842 Cataract extraction status, left eye: Secondary | ICD-10-CM | POA: Diagnosis not present

## 2016-12-17 DIAGNOSIS — R634 Abnormal weight loss: Secondary | ICD-10-CM | POA: Diagnosis not present

## 2017-06-18 DIAGNOSIS — I10 Essential (primary) hypertension: Secondary | ICD-10-CM | POA: Diagnosis not present

## 2017-06-18 DIAGNOSIS — F419 Anxiety disorder, unspecified: Secondary | ICD-10-CM | POA: Diagnosis not present

## 2017-06-18 DIAGNOSIS — H35039 Hypertensive retinopathy, unspecified eye: Secondary | ICD-10-CM | POA: Diagnosis not present

## 2017-08-16 DIAGNOSIS — H2511 Age-related nuclear cataract, right eye: Secondary | ICD-10-CM | POA: Diagnosis not present

## 2017-08-16 DIAGNOSIS — Z961 Presence of intraocular lens: Secondary | ICD-10-CM | POA: Diagnosis not present

## 2017-08-16 DIAGNOSIS — H353132 Nonexudative age-related macular degeneration, bilateral, intermediate dry stage: Secondary | ICD-10-CM | POA: Diagnosis not present

## 2017-12-23 DIAGNOSIS — R7309 Other abnormal glucose: Secondary | ICD-10-CM | POA: Diagnosis not present

## 2017-12-23 DIAGNOSIS — F419 Anxiety disorder, unspecified: Secondary | ICD-10-CM | POA: Diagnosis not present

## 2017-12-23 DIAGNOSIS — I10 Essential (primary) hypertension: Secondary | ICD-10-CM | POA: Diagnosis not present

## 2018-03-20 DIAGNOSIS — I83813 Varicose veins of bilateral lower extremities with pain: Secondary | ICD-10-CM | POA: Diagnosis not present

## 2018-05-23 DIAGNOSIS — I10 Essential (primary) hypertension: Secondary | ICD-10-CM | POA: Diagnosis not present

## 2018-06-10 DIAGNOSIS — I10 Essential (primary) hypertension: Secondary | ICD-10-CM | POA: Diagnosis not present

## 2018-06-10 DIAGNOSIS — Z79899 Other long term (current) drug therapy: Secondary | ICD-10-CM | POA: Diagnosis not present

## 2018-06-10 DIAGNOSIS — R5383 Other fatigue: Secondary | ICD-10-CM | POA: Diagnosis not present

## 2018-06-10 DIAGNOSIS — E559 Vitamin D deficiency, unspecified: Secondary | ICD-10-CM | POA: Diagnosis not present

## 2018-06-25 DIAGNOSIS — R9431 Abnormal electrocardiogram [ECG] [EKG]: Secondary | ICD-10-CM | POA: Diagnosis not present

## 2018-07-09 DIAGNOSIS — I10 Essential (primary) hypertension: Secondary | ICD-10-CM | POA: Diagnosis not present

## 2018-07-09 DIAGNOSIS — F458 Other somatoform disorders: Secondary | ICD-10-CM | POA: Diagnosis not present

## 2018-07-21 DIAGNOSIS — F458 Other somatoform disorders: Secondary | ICD-10-CM | POA: Diagnosis not present

## 2018-07-21 DIAGNOSIS — I10 Essential (primary) hypertension: Secondary | ICD-10-CM | POA: Diagnosis not present

## 2018-09-01 DIAGNOSIS — I10 Essential (primary) hypertension: Secondary | ICD-10-CM | POA: Diagnosis not present

## 2018-09-01 DIAGNOSIS — F458 Other somatoform disorders: Secondary | ICD-10-CM | POA: Diagnosis not present

## 2018-09-01 DIAGNOSIS — Z79899 Other long term (current) drug therapy: Secondary | ICD-10-CM | POA: Diagnosis not present

## 2018-11-03 DIAGNOSIS — F458 Other somatoform disorders: Secondary | ICD-10-CM | POA: Diagnosis not present

## 2018-11-03 DIAGNOSIS — Z7189 Other specified counseling: Secondary | ICD-10-CM | POA: Diagnosis not present

## 2018-11-03 DIAGNOSIS — I10 Essential (primary) hypertension: Secondary | ICD-10-CM | POA: Diagnosis not present

## 2018-11-03 DIAGNOSIS — Z79899 Other long term (current) drug therapy: Secondary | ICD-10-CM | POA: Diagnosis not present

## 2018-11-05 DIAGNOSIS — Z23 Encounter for immunization: Secondary | ICD-10-CM | POA: Diagnosis not present

## 2018-12-02 DIAGNOSIS — H2511 Age-related nuclear cataract, right eye: Secondary | ICD-10-CM | POA: Diagnosis not present

## 2018-12-02 DIAGNOSIS — Z961 Presence of intraocular lens: Secondary | ICD-10-CM | POA: Diagnosis not present

## 2018-12-02 DIAGNOSIS — H353132 Nonexudative age-related macular degeneration, bilateral, intermediate dry stage: Secondary | ICD-10-CM | POA: Diagnosis not present

## 2018-12-02 DIAGNOSIS — H353221 Exudative age-related macular degeneration, left eye, with active choroidal neovascularization: Secondary | ICD-10-CM | POA: Diagnosis not present

## 2018-12-24 DIAGNOSIS — H35432 Paving stone degeneration of retina, left eye: Secondary | ICD-10-CM | POA: Diagnosis not present

## 2018-12-24 DIAGNOSIS — H353114 Nonexudative age-related macular degeneration, right eye, advanced atrophic with subfoveal involvement: Secondary | ICD-10-CM | POA: Diagnosis not present

## 2018-12-24 DIAGNOSIS — H353221 Exudative age-related macular degeneration, left eye, with active choroidal neovascularization: Secondary | ICD-10-CM | POA: Diagnosis not present

## 2018-12-24 DIAGNOSIS — H43813 Vitreous degeneration, bilateral: Secondary | ICD-10-CM | POA: Diagnosis not present

## 2019-01-27 DIAGNOSIS — H43813 Vitreous degeneration, bilateral: Secondary | ICD-10-CM | POA: Diagnosis not present

## 2019-01-27 DIAGNOSIS — H353114 Nonexudative age-related macular degeneration, right eye, advanced atrophic with subfoveal involvement: Secondary | ICD-10-CM | POA: Diagnosis not present

## 2019-01-27 DIAGNOSIS — H35432 Paving stone degeneration of retina, left eye: Secondary | ICD-10-CM | POA: Diagnosis not present

## 2019-01-27 DIAGNOSIS — H353221 Exudative age-related macular degeneration, left eye, with active choroidal neovascularization: Secondary | ICD-10-CM | POA: Diagnosis not present

## 2019-02-21 ENCOUNTER — Ambulatory Visit: Payer: Self-pay

## 2019-02-24 DIAGNOSIS — H353114 Nonexudative age-related macular degeneration, right eye, advanced atrophic with subfoveal involvement: Secondary | ICD-10-CM | POA: Diagnosis not present

## 2019-02-24 DIAGNOSIS — H43813 Vitreous degeneration, bilateral: Secondary | ICD-10-CM | POA: Diagnosis not present

## 2019-02-24 DIAGNOSIS — H353221 Exudative age-related macular degeneration, left eye, with active choroidal neovascularization: Secondary | ICD-10-CM | POA: Diagnosis not present

## 2019-02-24 DIAGNOSIS — H35432 Paving stone degeneration of retina, left eye: Secondary | ICD-10-CM | POA: Diagnosis not present

## 2019-02-28 ENCOUNTER — Ambulatory Visit: Payer: PPO | Attending: Internal Medicine

## 2019-02-28 DIAGNOSIS — Z23 Encounter for immunization: Secondary | ICD-10-CM

## 2019-02-28 NOTE — Progress Notes (Signed)
   Covid-19 Vaccination Clinic  Name:  ANYIAH COVERDALE    MRN: [id number] DOB: Nov 22, 1940  02/28/2019  Ms. Gentles was observed post Covid-19 immunization for 15 minutes without incidence. She was provided with Vaccine Information Sheet and instruction to access the V-Safe system.   Ms. Oppedisano was instructed to call 911 with any severe reactions post vaccine: Marland Kitchen Difficulty breathing  . Swelling of your face and throat  . A fast heartbeat  . A bad rash all over your body  . Dizziness and weakness    Immunizations Administered    Name Date Dose VIS Date Route   Pfizer COVID-19 Vaccine 02/28/2019  9:53 AM 0.3 mL 01/02/2019 Intramuscular   Manufacturer: [employer], [employer]   Lot: [id number]   NDC: [address]-1

## 2019-03-02 DIAGNOSIS — I10 Essential (primary) hypertension: Secondary | ICD-10-CM | POA: Diagnosis not present

## 2019-03-02 DIAGNOSIS — Z79899 Other long term (current) drug therapy: Secondary | ICD-10-CM | POA: Diagnosis not present

## 2019-03-02 DIAGNOSIS — F458 Other somatoform disorders: Secondary | ICD-10-CM | POA: Diagnosis not present

## 2019-03-14 ENCOUNTER — Ambulatory Visit: Payer: Self-pay

## 2019-03-24 ENCOUNTER — Ambulatory Visit: Payer: PPO

## 2019-03-24 ENCOUNTER — Ambulatory Visit: Payer: PPO | Attending: Internal Medicine

## 2019-03-24 DIAGNOSIS — Z23 Encounter for immunization: Secondary | ICD-10-CM | POA: Insufficient documentation

## 2019-03-24 NOTE — Progress Notes (Signed)
   Covid-19 Vaccination Clinic  Name:  Nicole Robertson    MRN: [id number] DOB: 1940/07/07  03/24/2019  [name] was observed post Covid-19 immunization for 15 minutes without incident. She was provided with Vaccine Information Sheet and instruction to access the V-Safe system.   [name] was instructed to call 911 with any severe reactions post vaccine: Marland Kitchen Difficulty breathing  . Swelling of face and throat  . A fast heartbeat  . A bad rash all over body  . Dizziness and weakness   Immunizations Administered    Name Date Dose VIS Date Route   Pfizer COVID-19 Vaccine 03/24/2019 12:52 PM 0.3 mL 01/02/2019 Intramuscular   Manufacturer: [employer], [employer]   Lot: [id number]   NDC: [address]-8

## 2019-04-07 DIAGNOSIS — H353221 Exudative age-related macular degeneration, left eye, with active choroidal neovascularization: Secondary | ICD-10-CM | POA: Diagnosis not present

## 2019-04-07 DIAGNOSIS — H43813 Vitreous degeneration, bilateral: Secondary | ICD-10-CM | POA: Diagnosis not present

## 2019-04-07 DIAGNOSIS — H353114 Nonexudative age-related macular degeneration, right eye, advanced atrophic with subfoveal involvement: Secondary | ICD-10-CM | POA: Diagnosis not present

## 2019-04-07 DIAGNOSIS — H35432 Paving stone degeneration of retina, left eye: Secondary | ICD-10-CM | POA: Diagnosis not present

## 2019-04-08 DIAGNOSIS — H353114 Nonexudative age-related macular degeneration, right eye, advanced atrophic with subfoveal involvement: Secondary | ICD-10-CM | POA: Diagnosis not present

## 2019-04-08 DIAGNOSIS — S0502XA Injury of conjunctiva and corneal abrasion without foreign body, left eye, initial encounter: Secondary | ICD-10-CM | POA: Diagnosis not present

## 2019-04-08 DIAGNOSIS — H353221 Exudative age-related macular degeneration, left eye, with active choroidal neovascularization: Secondary | ICD-10-CM | POA: Diagnosis not present

## 2019-04-22 DIAGNOSIS — H16142 Punctate keratitis, left eye: Secondary | ICD-10-CM | POA: Diagnosis not present

## 2019-04-22 DIAGNOSIS — H353221 Exudative age-related macular degeneration, left eye, with active choroidal neovascularization: Secondary | ICD-10-CM | POA: Diagnosis not present

## 2019-05-05 DIAGNOSIS — H16142 Punctate keratitis, left eye: Secondary | ICD-10-CM | POA: Diagnosis not present

## 2019-05-05 DIAGNOSIS — H1132 Conjunctival hemorrhage, left eye: Secondary | ICD-10-CM | POA: Diagnosis not present

## 2019-05-22 DIAGNOSIS — H16142 Punctate keratitis, left eye: Secondary | ICD-10-CM | POA: Diagnosis not present

## 2019-06-02 DIAGNOSIS — S0502XA Injury of conjunctiva and corneal abrasion without foreign body, left eye, initial encounter: Secondary | ICD-10-CM | POA: Diagnosis not present

## 2019-06-02 DIAGNOSIS — H353114 Nonexudative age-related macular degeneration, right eye, advanced atrophic with subfoveal involvement: Secondary | ICD-10-CM | POA: Diagnosis not present

## 2019-06-02 DIAGNOSIS — H353221 Exudative age-related macular degeneration, left eye, with active choroidal neovascularization: Secondary | ICD-10-CM | POA: Diagnosis not present

## 2019-06-02 DIAGNOSIS — H43813 Vitreous degeneration, bilateral: Secondary | ICD-10-CM | POA: Diagnosis not present

## 2019-06-29 DIAGNOSIS — I10 Essential (primary) hypertension: Secondary | ICD-10-CM | POA: Diagnosis not present

## 2019-06-29 DIAGNOSIS — Z79899 Other long term (current) drug therapy: Secondary | ICD-10-CM | POA: Diagnosis not present

## 2019-06-29 DIAGNOSIS — F458 Other somatoform disorders: Secondary | ICD-10-CM | POA: Diagnosis not present

## 2019-07-14 DIAGNOSIS — H35371 Puckering of macula, right eye: Secondary | ICD-10-CM | POA: Diagnosis not present

## 2019-07-14 DIAGNOSIS — H43813 Vitreous degeneration, bilateral: Secondary | ICD-10-CM | POA: Diagnosis not present

## 2019-07-14 DIAGNOSIS — H353114 Nonexudative age-related macular degeneration, right eye, advanced atrophic with subfoveal involvement: Secondary | ICD-10-CM | POA: Diagnosis not present

## 2019-07-14 DIAGNOSIS — H353221 Exudative age-related macular degeneration, left eye, with active choroidal neovascularization: Secondary | ICD-10-CM | POA: Diagnosis not present

## 2019-08-25 DIAGNOSIS — H43813 Vitreous degeneration, bilateral: Secondary | ICD-10-CM | POA: Diagnosis not present

## 2019-08-25 DIAGNOSIS — H353221 Exudative age-related macular degeneration, left eye, with active choroidal neovascularization: Secondary | ICD-10-CM | POA: Diagnosis not present

## 2019-08-25 DIAGNOSIS — H353114 Nonexudative age-related macular degeneration, right eye, advanced atrophic with subfoveal involvement: Secondary | ICD-10-CM | POA: Diagnosis not present

## 2019-08-25 DIAGNOSIS — H35371 Puckering of macula, right eye: Secondary | ICD-10-CM | POA: Diagnosis not present

## 2019-09-29 DIAGNOSIS — Z79899 Other long term (current) drug therapy: Secondary | ICD-10-CM | POA: Diagnosis not present

## 2019-09-29 DIAGNOSIS — Z0000 Encounter for general adult medical examination without abnormal findings: Secondary | ICD-10-CM | POA: Diagnosis not present

## 2019-09-29 DIAGNOSIS — Z131 Encounter for screening for diabetes mellitus: Secondary | ICD-10-CM | POA: Diagnosis not present

## 2019-09-29 DIAGNOSIS — R0602 Shortness of breath: Secondary | ICD-10-CM | POA: Diagnosis not present

## 2019-09-29 DIAGNOSIS — E559 Vitamin D deficiency, unspecified: Secondary | ICD-10-CM | POA: Diagnosis not present

## 2019-09-29 DIAGNOSIS — E7800 Pure hypercholesterolemia, unspecified: Secondary | ICD-10-CM | POA: Diagnosis not present

## 2019-09-29 DIAGNOSIS — Z20822 Contact with and (suspected) exposure to covid-19: Secondary | ICD-10-CM | POA: Diagnosis not present

## 2019-09-29 DIAGNOSIS — R5383 Other fatigue: Secondary | ICD-10-CM | POA: Diagnosis not present

## 2019-09-29 DIAGNOSIS — Z1159 Encounter for screening for other viral diseases: Secondary | ICD-10-CM | POA: Diagnosis not present

## 2019-10-06 DIAGNOSIS — H35371 Puckering of macula, right eye: Secondary | ICD-10-CM | POA: Diagnosis not present

## 2019-10-06 DIAGNOSIS — H353114 Nonexudative age-related macular degeneration, right eye, advanced atrophic with subfoveal involvement: Secondary | ICD-10-CM | POA: Diagnosis not present

## 2019-10-06 DIAGNOSIS — H43813 Vitreous degeneration, bilateral: Secondary | ICD-10-CM | POA: Diagnosis not present

## 2019-10-06 DIAGNOSIS — H353221 Exudative age-related macular degeneration, left eye, with active choroidal neovascularization: Secondary | ICD-10-CM | POA: Diagnosis not present

## 2019-11-17 DIAGNOSIS — H35371 Puckering of macula, right eye: Secondary | ICD-10-CM | POA: Diagnosis not present

## 2019-11-17 DIAGNOSIS — H43813 Vitreous degeneration, bilateral: Secondary | ICD-10-CM | POA: Diagnosis not present

## 2019-11-17 DIAGNOSIS — H353221 Exudative age-related macular degeneration, left eye, with active choroidal neovascularization: Secondary | ICD-10-CM | POA: Diagnosis not present

## 2019-11-17 DIAGNOSIS — H353114 Nonexudative age-related macular degeneration, right eye, advanced atrophic with subfoveal involvement: Secondary | ICD-10-CM | POA: Diagnosis not present

## 2019-12-07 DIAGNOSIS — H524 Presbyopia: Secondary | ICD-10-CM | POA: Diagnosis not present

## 2019-12-07 DIAGNOSIS — H16142 Punctate keratitis, left eye: Secondary | ICD-10-CM | POA: Diagnosis not present

## 2019-12-07 DIAGNOSIS — H2511 Age-related nuclear cataract, right eye: Secondary | ICD-10-CM | POA: Diagnosis not present

## 2019-12-07 DIAGNOSIS — H353221 Exudative age-related macular degeneration, left eye, with active choroidal neovascularization: Secondary | ICD-10-CM | POA: Diagnosis not present

## 2019-12-07 DIAGNOSIS — H353112 Nonexudative age-related macular degeneration, right eye, intermediate dry stage: Secondary | ICD-10-CM | POA: Diagnosis not present

## 2019-12-29 DIAGNOSIS — H353221 Exudative age-related macular degeneration, left eye, with active choroidal neovascularization: Secondary | ICD-10-CM | POA: Diagnosis not present

## 2019-12-29 DIAGNOSIS — H353114 Nonexudative age-related macular degeneration, right eye, advanced atrophic with subfoveal involvement: Secondary | ICD-10-CM | POA: Diagnosis not present

## 2019-12-29 DIAGNOSIS — H35371 Puckering of macula, right eye: Secondary | ICD-10-CM | POA: Diagnosis not present

## 2019-12-29 DIAGNOSIS — H43813 Vitreous degeneration, bilateral: Secondary | ICD-10-CM | POA: Diagnosis not present

## 2020-01-25 DIAGNOSIS — I10 Essential (primary) hypertension: Secondary | ICD-10-CM | POA: Diagnosis not present

## 2020-01-25 DIAGNOSIS — Z79899 Other long term (current) drug therapy: Secondary | ICD-10-CM | POA: Diagnosis not present

## 2020-01-25 DIAGNOSIS — F458 Other somatoform disorders: Secondary | ICD-10-CM | POA: Diagnosis not present

## 2020-02-09 DIAGNOSIS — H353221 Exudative age-related macular degeneration, left eye, with active choroidal neovascularization: Secondary | ICD-10-CM | POA: Diagnosis not present

## 2020-02-09 DIAGNOSIS — H35371 Puckering of macula, right eye: Secondary | ICD-10-CM | POA: Diagnosis not present

## 2020-02-09 DIAGNOSIS — H43821 Vitreomacular adhesion, right eye: Secondary | ICD-10-CM | POA: Diagnosis not present

## 2020-02-09 DIAGNOSIS — H353114 Nonexudative age-related macular degeneration, right eye, advanced atrophic with subfoveal involvement: Secondary | ICD-10-CM | POA: Diagnosis not present

## 2020-03-22 DIAGNOSIS — H353221 Exudative age-related macular degeneration, left eye, with active choroidal neovascularization: Secondary | ICD-10-CM | POA: Diagnosis not present

## 2020-03-22 DIAGNOSIS — H43821 Vitreomacular adhesion, right eye: Secondary | ICD-10-CM | POA: Diagnosis not present

## 2020-03-22 DIAGNOSIS — H353114 Nonexudative age-related macular degeneration, right eye, advanced atrophic with subfoveal involvement: Secondary | ICD-10-CM | POA: Diagnosis not present

## 2020-03-22 DIAGNOSIS — H35371 Puckering of macula, right eye: Secondary | ICD-10-CM | POA: Diagnosis not present

## 2020-05-03 DIAGNOSIS — H353114 Nonexudative age-related macular degeneration, right eye, advanced atrophic with subfoveal involvement: Secondary | ICD-10-CM | POA: Diagnosis not present

## 2020-05-03 DIAGNOSIS — H353221 Exudative age-related macular degeneration, left eye, with active choroidal neovascularization: Secondary | ICD-10-CM | POA: Diagnosis not present

## 2020-05-03 DIAGNOSIS — H43821 Vitreomacular adhesion, right eye: Secondary | ICD-10-CM | POA: Diagnosis not present

## 2020-05-03 DIAGNOSIS — H35371 Puckering of macula, right eye: Secondary | ICD-10-CM | POA: Diagnosis not present

## 2020-05-23 DIAGNOSIS — Z79899 Other long term (current) drug therapy: Secondary | ICD-10-CM | POA: Diagnosis not present

## 2020-05-23 DIAGNOSIS — F458 Other somatoform disorders: Secondary | ICD-10-CM | POA: Diagnosis not present

## 2020-05-23 DIAGNOSIS — I10 Essential (primary) hypertension: Secondary | ICD-10-CM | POA: Diagnosis not present

## 2020-06-21 DIAGNOSIS — H353114 Nonexudative age-related macular degeneration, right eye, advanced atrophic with subfoveal involvement: Secondary | ICD-10-CM | POA: Diagnosis not present

## 2020-06-21 DIAGNOSIS — H35371 Puckering of macula, right eye: Secondary | ICD-10-CM | POA: Diagnosis not present

## 2020-06-21 DIAGNOSIS — H43821 Vitreomacular adhesion, right eye: Secondary | ICD-10-CM | POA: Diagnosis not present

## 2020-06-21 DIAGNOSIS — H353221 Exudative age-related macular degeneration, left eye, with active choroidal neovascularization: Secondary | ICD-10-CM | POA: Diagnosis not present

## 2020-08-02 DIAGNOSIS — I10 Essential (primary) hypertension: Secondary | ICD-10-CM | POA: Diagnosis not present

## 2020-08-02 DIAGNOSIS — F458 Other somatoform disorders: Secondary | ICD-10-CM | POA: Diagnosis not present

## 2020-08-02 DIAGNOSIS — Z79899 Other long term (current) drug therapy: Secondary | ICD-10-CM | POA: Diagnosis not present

## 2020-08-03 DIAGNOSIS — H353221 Exudative age-related macular degeneration, left eye, with active choroidal neovascularization: Secondary | ICD-10-CM | POA: Diagnosis not present

## 2020-08-03 DIAGNOSIS — H35371 Puckering of macula, right eye: Secondary | ICD-10-CM | POA: Diagnosis not present

## 2020-08-03 DIAGNOSIS — H353114 Nonexudative age-related macular degeneration, right eye, advanced atrophic with subfoveal involvement: Secondary | ICD-10-CM | POA: Diagnosis not present

## 2020-08-03 DIAGNOSIS — H43821 Vitreomacular adhesion, right eye: Secondary | ICD-10-CM | POA: Diagnosis not present

## 2020-08-04 DIAGNOSIS — Z79899 Other long term (current) drug therapy: Secondary | ICD-10-CM | POA: Diagnosis not present

## 2020-09-19 DIAGNOSIS — Z1159 Encounter for screening for other viral diseases: Secondary | ICD-10-CM | POA: Diagnosis not present

## 2020-09-19 DIAGNOSIS — R0602 Shortness of breath: Secondary | ICD-10-CM | POA: Diagnosis not present

## 2020-09-19 DIAGNOSIS — Z79899 Other long term (current) drug therapy: Secondary | ICD-10-CM | POA: Diagnosis not present

## 2020-09-19 DIAGNOSIS — Z0000 Encounter for general adult medical examination without abnormal findings: Secondary | ICD-10-CM | POA: Diagnosis not present

## 2020-09-19 DIAGNOSIS — E7800 Pure hypercholesterolemia, unspecified: Secondary | ICD-10-CM | POA: Diagnosis not present

## 2020-09-19 DIAGNOSIS — E559 Vitamin D deficiency, unspecified: Secondary | ICD-10-CM | POA: Diagnosis not present

## 2020-09-19 DIAGNOSIS — R5383 Other fatigue: Secondary | ICD-10-CM | POA: Diagnosis not present

## 2020-09-21 DIAGNOSIS — H353114 Nonexudative age-related macular degeneration, right eye, advanced atrophic with subfoveal involvement: Secondary | ICD-10-CM | POA: Diagnosis not present

## 2020-09-21 DIAGNOSIS — H43813 Vitreous degeneration, bilateral: Secondary | ICD-10-CM | POA: Diagnosis not present

## 2020-09-21 DIAGNOSIS — H35371 Puckering of macula, right eye: Secondary | ICD-10-CM | POA: Diagnosis not present

## 2020-09-21 DIAGNOSIS — H353221 Exudative age-related macular degeneration, left eye, with active choroidal neovascularization: Secondary | ICD-10-CM | POA: Diagnosis not present

## 2020-11-08 DIAGNOSIS — H353221 Exudative age-related macular degeneration, left eye, with active choroidal neovascularization: Secondary | ICD-10-CM | POA: Diagnosis not present

## 2020-11-08 DIAGNOSIS — H35371 Puckering of macula, right eye: Secondary | ICD-10-CM | POA: Diagnosis not present

## 2020-11-08 DIAGNOSIS — H43813 Vitreous degeneration, bilateral: Secondary | ICD-10-CM | POA: Diagnosis not present

## 2020-11-08 DIAGNOSIS — H353114 Nonexudative age-related macular degeneration, right eye, advanced atrophic with subfoveal involvement: Secondary | ICD-10-CM | POA: Diagnosis not present

## 2020-12-12 DIAGNOSIS — H2511 Age-related nuclear cataract, right eye: Secondary | ICD-10-CM | POA: Diagnosis not present

## 2020-12-12 DIAGNOSIS — H25011 Cortical age-related cataract, right eye: Secondary | ICD-10-CM | POA: Diagnosis not present

## 2020-12-12 DIAGNOSIS — H353112 Nonexudative age-related macular degeneration, right eye, intermediate dry stage: Secondary | ICD-10-CM | POA: Diagnosis not present

## 2020-12-12 DIAGNOSIS — H353221 Exudative age-related macular degeneration, left eye, with active choroidal neovascularization: Secondary | ICD-10-CM | POA: Diagnosis not present

## 2020-12-19 DIAGNOSIS — E2740 Unspecified adrenocortical insufficiency: Secondary | ICD-10-CM | POA: Diagnosis not present

## 2020-12-19 DIAGNOSIS — I10 Essential (primary) hypertension: Secondary | ICD-10-CM | POA: Diagnosis not present

## 2020-12-19 DIAGNOSIS — F458 Other somatoform disorders: Secondary | ICD-10-CM | POA: Diagnosis not present

## 2020-12-19 DIAGNOSIS — H3530 Unspecified macular degeneration: Secondary | ICD-10-CM | POA: Diagnosis not present

## 2020-12-19 DIAGNOSIS — Z79899 Other long term (current) drug therapy: Secondary | ICD-10-CM | POA: Diagnosis not present

## 2020-12-22 DIAGNOSIS — Z79899 Other long term (current) drug therapy: Secondary | ICD-10-CM | POA: Diagnosis not present

## 2020-12-27 DIAGNOSIS — H353114 Nonexudative age-related macular degeneration, right eye, advanced atrophic with subfoveal involvement: Secondary | ICD-10-CM | POA: Diagnosis not present

## 2020-12-27 DIAGNOSIS — H35432 Paving stone degeneration of retina, left eye: Secondary | ICD-10-CM | POA: Diagnosis not present

## 2020-12-27 DIAGNOSIS — H353221 Exudative age-related macular degeneration, left eye, with active choroidal neovascularization: Secondary | ICD-10-CM | POA: Diagnosis not present

## 2020-12-27 DIAGNOSIS — H43813 Vitreous degeneration, bilateral: Secondary | ICD-10-CM | POA: Diagnosis not present
